# Patient Record
Sex: Female | Born: 1965 | Race: White | Hispanic: No | Smoking: Never smoker
Health system: Southern US, Community
[De-identification: ages and names within clinical notes are randomized; demographics above are authoritative.]

## PROBLEM LIST (undated history)

## (undated) DIAGNOSIS — C4491 Basal cell carcinoma of skin, unspecified: Secondary | ICD-10-CM

## (undated) HISTORY — DX: Basal cell carcinoma of skin, unspecified: C44.91

---

## 1999-09-27 ENCOUNTER — Inpatient Hospital Stay (HOSPITAL_COMMUNITY): Admission: AD | Admit: 1999-09-27 | Discharge: 1999-09-29 | Payer: Self-pay | Admitting: Physical Therapy

## 1999-12-17 ENCOUNTER — Emergency Department (HOSPITAL_COMMUNITY): Admission: EM | Admit: 1999-12-17 | Discharge: 1999-12-17 | Payer: Self-pay | Admitting: Emergency Medicine

## 2000-02-24 ENCOUNTER — Ambulatory Visit (HOSPITAL_COMMUNITY): Admission: RE | Admit: 2000-02-24 | Discharge: 2000-02-24 | Payer: Self-pay | Admitting: Family Medicine

## 2000-02-24 ENCOUNTER — Encounter: Payer: Self-pay | Admitting: Family Medicine

## 2001-02-23 ENCOUNTER — Other Ambulatory Visit: Admission: RE | Admit: 2001-02-23 | Discharge: 2001-02-23 | Payer: Self-pay | Admitting: Obstetrics and Gynecology

## 2002-03-03 ENCOUNTER — Other Ambulatory Visit: Admission: RE | Admit: 2002-03-03 | Discharge: 2002-03-03 | Payer: Self-pay | Admitting: Obstetrics and Gynecology

## 2003-03-26 ENCOUNTER — Other Ambulatory Visit: Admission: RE | Admit: 2003-03-26 | Discharge: 2003-03-26 | Payer: Self-pay | Admitting: Obstetrics and Gynecology

## 2004-03-25 ENCOUNTER — Other Ambulatory Visit: Admission: RE | Admit: 2004-03-25 | Discharge: 2004-03-25 | Payer: Self-pay | Admitting: Obstetrics and Gynecology

## 2004-10-02 ENCOUNTER — Other Ambulatory Visit: Admission: RE | Admit: 2004-10-02 | Discharge: 2004-10-02 | Payer: Self-pay | Admitting: Obstetrics and Gynecology

## 2005-03-04 ENCOUNTER — Encounter: Admission: RE | Admit: 2005-03-04 | Discharge: 2005-03-04 | Payer: Self-pay | Admitting: Internal Medicine

## 2005-05-06 ENCOUNTER — Other Ambulatory Visit: Admission: RE | Admit: 2005-05-06 | Discharge: 2005-05-06 | Payer: Self-pay | Admitting: Obstetrics and Gynecology

## 2014-01-26 DIAGNOSIS — C4491 Basal cell carcinoma of skin, unspecified: Secondary | ICD-10-CM

## 2014-01-26 HISTORY — DX: Basal cell carcinoma of skin, unspecified: C44.91

## 2014-12-17 ENCOUNTER — Telehealth: Payer: Self-pay

## 2014-12-17 NOTE — Telephone Encounter (Signed)
Pre Visit call completed. 

## 2014-12-18 ENCOUNTER — Encounter: Payer: Self-pay | Admitting: Family

## 2014-12-18 ENCOUNTER — Telehealth: Payer: Self-pay | Admitting: Family

## 2014-12-18 ENCOUNTER — Ambulatory Visit (INDEPENDENT_AMBULATORY_CARE_PROVIDER_SITE_OTHER): Payer: 59 | Admitting: Family

## 2014-12-18 VITALS — BP 128/78 | HR 63 | Temp 98.5°F | Resp 18 | Ht 63.0 in | Wt 157.6 lb

## 2014-12-18 DIAGNOSIS — L989 Disorder of the skin and subcutaneous tissue, unspecified: Secondary | ICD-10-CM

## 2014-12-18 DIAGNOSIS — H33321 Round hole, right eye: Secondary | ICD-10-CM

## 2014-12-18 MED ORDER — CLOTRIMAZOLE-BETAMETHASONE 1-0.05 % EX CREA: 1.0000 "application " | TOPICAL_CREAM | Freq: Two times a day (BID) | CUTANEOUS | Status: DC

## 2014-12-18 NOTE — Progress Notes (Signed)
Subjective:    Patient ID: April Hendrix, female    DOB: 1965/05/30, 49 y.o.   MRN: [id number]  HPI  [name] is a 49 yr old female who presents today to establish care.  She reports that she is overall very healthy but has not had a PCP in some time.  She reports an area of skin irritation located on the left shoulder and on her back. Notes that she has applied hydrocortisone to the area on her shoulder and it will go away temporarily.   Reports that she was told that she had a "hole" in her retina by her optometrist Jules Husbands and was recommend that she be referred to opthalmology. However, per pt she could not complete referral because it needed to be initiated by PCP.   Review of Systems  Constitutional: Negative for unexpected weight change.  HENT: Negative for hearing loss and rhinorrhea.   Eyes: Negative for visual disturbance.  Respiratory: Negative for cough.   Cardiovascular: Negative for leg swelling.  Gastrointestinal: Negative for diarrhea and constipation.  Genitourinary: Negative for dysuria and frequency.  Musculoskeletal: Negative for myalgias and arthralgias.  Neurological: Negative for headaches.  Hematological: Negative for adenopathy.  Psychiatric/Behavioral:       Denies depression/anxiety, sees a counselor   History reviewed. No pertinent past medical history.  Social History   Social History  . Marital Status: Divorced    Spouse Name: N/A  . Number of Children: N/A  . Years of Education: N/A   Occupational History  . Not on file.   Social History Main Topics  . Smoking status: Never Smoker   . Smokeless tobacco: Not on file  . Alcohol Use: 3.6 - 4.2 oz/week    6-7 Standard drinks or equivalent per week  . Drug Use: No  . Sexual Activity: Not on file   Other Topics Concern  . Not on file   Social History Narrative    History reviewed. No pertinent past surgical history.  Family History  Problem Relation Age of Onset  .  Hyperlipidemia Mother   . Hypertension Mother   . Hyperlipidemia Father   . Cancer Father     prostate.  . Hypertension Father   . Stroke Maternal Grandmother   . Arthritis Maternal Grandmother   . Arthritis Maternal Grandfather   . Arthritis Paternal Grandmother   . Arthritis Paternal Grandfather   . Diabetes Neg Hx   . Heart disease Neg Hx     No Known Allergies  No current outpatient prescriptions on file prior to visit.   No current facility-administered medications on file prior to visit.    BP 128/78 mmHg  Pulse 63  Temp(Src) 98.5 F (36.9 C) (Oral)  Resp 18  Ht 5\' 3"  (1.6 m)  Wt 157 lb 9.6 oz (71.487 kg)  BMI 27.92 kg/m2  SpO2 100%       Objective:   Physical Exam  Constitutional: She is oriented to person, place, and time. She appears well-developed and well-nourished.  HENT:  Head: Normocephalic and atraumatic.  Cardiovascular: Normal rate, regular rhythm and normal heart sounds.   No murmur heard. Pulmonary/Chest: Effort normal and breath sounds normal. No respiratory distress. She has no wheezes.  Neurological: She is alert and oriented to person, place, and time.  Skin: Skin is warm and dry.  Raise dry appearing patches of skin noted on top of left shoulder and mid back  Psychiatric: She has a normal mood and affect. Her behavior  is normal. Judgment and thought content normal.          Assessment & Plan:  Skin lesions- ? Fungal, ? Eczema- will rx with lotrisone and refer to derm for further evaluation  Retinal problem- I don't have the details. We will request formal diagnosis and name of specialist that her optometrist recommends and place opthalmology referral.

## 2014-12-18 NOTE — Patient Instructions (Addendum)
Please contact us with the number of your optometrist so we can obtain info for your opthalmology referral. Welcome to [facility]!

## 2014-12-18 NOTE — Telephone Encounter (Signed)
Could you please contact Dr. Smitty Pluck optometrist's office and ask them:  1) What is retinal diagnosis for which they are referring patient to specialist?  2) Who do they recommend we set her up with?

## 2014-12-18 NOTE — Progress Notes (Signed)
Pre visit review using our clinic review tool, if applicable. No additional management support is needed unless otherwise documented below in the visit note. 

## 2014-12-18 NOTE — Telephone Encounter (Signed)
Spoke with Lilia Pro at [facility] in [place] [phone]). They previously referred pt to Dr Anderson Malta at [place] for a "retinal hole, OD" but pt's insurance requires referral. We can place referral to Dr Anderson Malta.

## 2014-12-25 ENCOUNTER — Encounter: Payer: Self-pay | Admitting: Family

## 2015-02-13 ENCOUNTER — Encounter: Payer: Self-pay | Admitting: Family

## 2015-02-13 ENCOUNTER — Ambulatory Visit (INDEPENDENT_AMBULATORY_CARE_PROVIDER_SITE_OTHER): Payer: BLUE CROSS/BLUE SHIELD | Admitting: Family

## 2015-02-13 VITALS — BP 131/71 | HR 65 | Temp 98.6°F | Resp 16 | Ht 63.0 in | Wt 157.0 lb

## 2015-02-13 DIAGNOSIS — Z23 Encounter for immunization: Secondary | ICD-10-CM | POA: Diagnosis not present

## 2015-02-13 DIAGNOSIS — Z0001 Encounter for general adult medical examination with abnormal findings: Secondary | ICD-10-CM | POA: Diagnosis not present

## 2015-02-13 DIAGNOSIS — N951 Menopausal and female climacteric states: Secondary | ICD-10-CM

## 2015-02-13 DIAGNOSIS — Z0000 Encounter for general adult medical examination without abnormal findings: Secondary | ICD-10-CM | POA: Diagnosis not present

## 2015-02-13 DIAGNOSIS — C4491 Basal cell carcinoma of skin, unspecified: Secondary | ICD-10-CM | POA: Insufficient documentation

## 2015-02-13 DIAGNOSIS — R232 Flushing: Secondary | ICD-10-CM | POA: Insufficient documentation

## 2015-02-13 LAB — CBC WITH DIFFERENTIAL/PLATELET
Basophils Absolute: 0 10*3/uL (ref 0.0–0.1)
Basophils Relative: 0.7 % (ref 0.0–3.0)
Eosinophils Absolute: 0.2 10*3/uL (ref 0.0–0.7)
Eosinophils Relative: 3.5 % (ref 0.0–5.0)
HCT: 42.2 % (ref 36.0–46.0)
Hemoglobin: 14.2 g/dL (ref 12.0–15.0)
Lymphocytes Relative: 23.8 % (ref 12.0–46.0)
Lymphs Abs: 1.4 10*3/uL (ref 0.7–4.0)
MCHC: 33.7 g/dL (ref 30.0–36.0)
MCV: 96.9 fl (ref 78.0–100.0)
Monocytes Absolute: 0.4 10*3/uL (ref 0.1–1.0)
Monocytes Relative: 6.7 % (ref 3.0–12.0)
Neutro Abs: 3.9 10*3/uL (ref 1.4–7.7)
Neutrophils Relative %: 65.3 % (ref 43.0–77.0)
Platelets: 245 10*3/uL (ref 150.0–400.0)
RBC: 4.35 Mil/uL (ref 3.87–5.11)
RDW: 11.9 % (ref 11.5–15.5)
WBC: 5.9 10*3/uL (ref 4.0–10.5)

## 2015-02-13 LAB — URINALYSIS, ROUTINE W REFLEX MICROSCOPIC
Bilirubin Urine: NEGATIVE
Hgb urine dipstick: NEGATIVE
Ketones, ur: NEGATIVE
Leukocytes, UA: NEGATIVE
Nitrite: NEGATIVE
RBC / HPF: NONE SEEN (ref 0–?)
Specific Gravity, Urine: 1.015 (ref 1.000–1.030)
Total Protein, Urine: NEGATIVE
Urine Glucose: NEGATIVE
Urobilinogen, UA: 0.2 (ref 0.0–1.0)
WBC, UA: NONE SEEN (ref 0–?)
pH: 7.5 (ref 5.0–8.0)

## 2015-02-13 LAB — LIPID PANEL
CHOLESTEROL: 261 mg/dL — AB (ref 0–200)
HDL: 108.9 mg/dL (ref >39.00)
LDL Cholesterol: 138 mg/dL — ABNORMAL HIGH (ref 0–99)
NonHDL: 152.25
TRIGLYCERIDES: 72 mg/dL (ref 0.0–149.0)
Total CHOL/HDL Ratio: 2
VLDL: 14.4 mg/dL (ref 0.0–40.0)

## 2015-02-13 LAB — HEPATIC FUNCTION PANEL
ALT: 13 U/L (ref 0–35)
AST: 18 U/L (ref 0–37)
Albumin: 4.5 g/dL (ref 3.5–5.2)
Alkaline Phosphatase: 54 U/L (ref 39–117)
Bilirubin, Direct: 0.1 mg/dL (ref 0.0–0.3)
Total Bilirubin: 0.7 mg/dL (ref 0.2–1.2)
Total Protein: 7.2 g/dL (ref 6.0–8.3)

## 2015-02-13 LAB — BASIC METABOLIC PANEL
BUN: 14 mg/dL (ref 6–23)
CO2: 27 mEq/L (ref 19–32)
Calcium: 9.4 mg/dL (ref 8.4–10.5)
Chloride: 104 mEq/L (ref 96–112)
Creatinine, Ser: 0.76 mg/dL (ref 0.40–1.20)
GFR: 85.2 mL/min (ref >60.00)
Glucose, Bld: 108 mg/dL — ABNORMAL HIGH (ref 70–99)
Potassium: 4.2 mEq/L (ref 3.5–5.1)
Sodium: 139 mEq/L (ref 135–145)

## 2015-02-13 LAB — TSH: TSH: 3.35 u[IU]/mL (ref 0.35–4.50)

## 2015-02-13 MED ORDER — GABAPENTIN 300 MG PO CAPS: 300.0000 mg | ORAL_CAPSULE | Freq: Every day | ORAL | Status: AC

## 2015-02-13 NOTE — Progress Notes (Signed)
Pre visit review using our clinic review tool, if applicable. No additional management support is needed unless otherwise documented below in the visit note. 

## 2015-02-13 NOTE — Assessment & Plan Note (Addendum)
Discussed healthy diet, exercise.  Refer for colonoscopy. Discussed increasing exercise.obtain routine lab work. Tdap today.

## 2015-02-13 NOTE — Patient Instructions (Addendum)
Please complete lab work prior to leaving. You will be contacted about your referral for colonoscopy. Please schedule routine dental follow up. Start gabapentin 300mg  cap at bedtime for hot flashes.

## 2015-02-13 NOTE — Assessment & Plan Note (Signed)
Will give trial of gabapentin QHS for hot flashes.

## 2015-02-13 NOTE — Progress Notes (Signed)
Subjective:    Patient ID: April Hendrix, female    DOB: 09-01-1965, 50 y.o.   MRN: [id number]  HPI  Patient presents today for complete physical.  Immunizations: due for tetanus, declines flu shot Diet: diet is healthy Exercise:  A little- plans to start tennis Colonoscopy:  due Pap Smear: 4/16- normal per pt completed by GYN Mammogram: 4/16 Vision:  Up to date Dental: due- got dental insurance  Notes that spots on her back were basal cell carcinoma.  Removed by dermatology  She complains of hot flashes.  They are especially bad a night.   Review of Systems  Constitutional: Negative for unexpected weight change.  HENT: Negative for hearing loss.   Eyes: Negative for visual disturbance.  Respiratory: Negative for cough.   Cardiovascular: Negative for chest pain and leg swelling.  Gastrointestinal: Negative for diarrhea and constipation.  Genitourinary: Negative for dysuria and frequency.  Musculoskeletal: Negative for myalgias and arthralgias.  Skin: Negative for rash.  Neurological: Negative for headaches.  Hematological: Negative for adenopathy.  Psychiatric/Behavioral:       Denies depression/anxiety   Past Medical History  Diagnosis Date  . Basal cell carcinoma 2016    2 lesions on back ([facility] [place] [facility])    Social History   Social History  . Marital Status: Divorced    Spouse Name: N/A  . Number of Children: N/A  . Years of Education: N/A   Occupational History  . Not on file.   Social History Main Topics  . Smoking status: Never Smoker   . Smokeless tobacco: Not on file  . Alcohol Use: 3.6 - 4.2 oz/week    6-7 Standard drinks or equivalent per week  . Drug Use: No  . Sexual Activity: Not on file   Other Topics Concern  . 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     Single   Enjoys working, taking care of her kids    No past surgical history on file.  Family History  Problem Relation Age of Onset  . Hyperlipidemia Mother   . Hypertension Mother   . Hyperlipidemia Father   . Cancer Father     prostate.  . Hypertension Father   . Stroke Maternal Grandmother   . Arthritis Maternal Grandmother   . Arthritis Maternal Grandfather   . Arthritis Paternal Grandmother   . Arthritis Paternal Grandfather   . Diabetes Neg Hx   . Heart disease Neg Hx     No Known Allergies  No current outpatient prescriptions on file prior to visit.   No current facility-administered medications on file prior to visit.    BP 131/71 mmHg  Pulse 65  Temp(Src) 98.6 F (37 C) (Oral)  Resp 16  Ht 5\' 3"  (1.6 m)  Wt 157 lb (71.215 kg)  BMI 27.82 kg/m2  SpO2 100%       Objective:   Physical Exam  Physical Exam  Constitutional: She is oriented to person, place, and time. She appears well-developed and well-nourished. No distress.  HENT:  Head: Normocephalic and atraumatic.  Right Ear: Tympanic membrane and ear canal normal.  Left Ear: Tympanic membrane and ear canal normal.  Mouth/Throat: Oropharynx is clear and moist.  Eyes: Pupils are equal, round, and reactive to light. No scleral  icterus.  Neck: Normal range of motion. No thyromegaly present.  Cardiovascular: Normal rate and regular rhythm.   No murmur heard. Pulmonary/Chest: Effort normal and breath sounds normal. No respiratory distress. He has no wheezes. She has no rales. She exhibits no tenderness.  Abdominal: Soft. Bowel sounds are normal. He exhibits no distension and no mass. There is no tenderness. There is no rebound and no guarding.  Musculoskeletal: She exhibits no edema.  Lymphadenopathy:    She has no cervical adenopathy.  Neurological: She is alert and oriented to person, place, and time. She has normal patellar reflexes. She exhibits normal muscle tone. Coordination normal.  Skin:  Skin is warm and dry.  Psychiatric: She has a normal mood and affect. Her behavior is normal. Judgment and thought content normal.  Breasts: Examined lying Right: Without masses, retractions, discharge or axillary adenopathy.  Left: Without masses, retractions, discharge or axillary adenopathy.      Assessment & Plan:         Assessment & Plan:  EKG tracing is personally reviewed.  EKG notes NSR.  No acute changes.

## 2015-02-17 ENCOUNTER — Telehealth: Payer: Self-pay | Admitting: Family

## 2015-02-17 DIAGNOSIS — E785 Hyperlipidemia, unspecified: Secondary | ICD-10-CM | POA: Insufficient documentation

## 2015-02-17 DIAGNOSIS — R739 Hyperglycemia, unspecified: Secondary | ICD-10-CM

## 2015-02-17 NOTE — Telephone Encounter (Signed)
Please let pt know cholesterol is elevated, sugar mildly elevated but not in diabetic range.  Advise pt to work on low fat/low cholesterol diet, exercise and avoiding concentrated sweets.  Repeat labs in 6 months (a1c dx hyperglycemia, lipids- hyperlipidemia)

## 2015-02-18 NOTE — Telephone Encounter (Signed)
Notified pt and she voices understanding. Lab appt scheduled for 08/20/15 at 9am.  Future orders entered.

## 2015-05-02 DIAGNOSIS — F4310 Post-traumatic stress disorder, unspecified: Secondary | ICD-10-CM | POA: Diagnosis not present

## 2015-05-13 DIAGNOSIS — Z6827 Body mass index (BMI) 27.0-27.9, adult: Secondary | ICD-10-CM | POA: Diagnosis not present

## 2015-05-13 DIAGNOSIS — Z1231 Encounter for screening mammogram for malignant neoplasm of breast: Secondary | ICD-10-CM | POA: Diagnosis not present

## 2015-05-13 DIAGNOSIS — Z01419 Encounter for gynecological examination (general) (routine) without abnormal findings: Secondary | ICD-10-CM | POA: Diagnosis not present

## 2015-05-16 DIAGNOSIS — Z85828 Personal history of other malignant neoplasm of skin: Secondary | ICD-10-CM | POA: Diagnosis not present

## 2015-05-16 DIAGNOSIS — L578 Other skin changes due to chronic exposure to nonionizing radiation: Secondary | ICD-10-CM | POA: Diagnosis not present

## 2015-05-16 DIAGNOSIS — L299 Pruritus, unspecified: Secondary | ICD-10-CM | POA: Diagnosis not present

## 2015-05-16 DIAGNOSIS — Z08 Encounter for follow-up examination after completed treatment for malignant neoplasm: Secondary | ICD-10-CM | POA: Diagnosis not present

## 2015-05-17 ENCOUNTER — Other Ambulatory Visit: Payer: Self-pay | Admitting: Obstetrics and Gynecology

## 2015-05-17 DIAGNOSIS — R928 Other abnormal and inconclusive findings on diagnostic imaging of breast: Secondary | ICD-10-CM

## 2015-05-23 ENCOUNTER — Other Ambulatory Visit: Payer: BLUE CROSS/BLUE SHIELD

## 2015-05-24 ENCOUNTER — Other Ambulatory Visit: Payer: BLUE CROSS/BLUE SHIELD

## 2015-05-24 ENCOUNTER — Ambulatory Visit
Admission: RE | Admit: 2015-05-24 | Discharge: 2015-05-24 | Disposition: A | Discharge status: Home / Self Care | Payer: BLUE CROSS/BLUE SHIELD | Source: Ambulatory Visit | Attending: Obstetrics and Gynecology | Admitting: Obstetrics and Gynecology

## 2015-05-24 DIAGNOSIS — R922 Inconclusive mammogram: Secondary | ICD-10-CM | POA: Diagnosis not present

## 2015-05-24 DIAGNOSIS — N63 Unspecified lump in breast: Secondary | ICD-10-CM | POA: Diagnosis not present

## 2015-05-24 DIAGNOSIS — R928 Other abnormal and inconclusive findings on diagnostic imaging of breast: Secondary | ICD-10-CM

## 2015-08-20 ENCOUNTER — Other Ambulatory Visit: Payer: BLUE CROSS/BLUE SHIELD

## 2015-09-02 ENCOUNTER — Other Ambulatory Visit (INDEPENDENT_AMBULATORY_CARE_PROVIDER_SITE_OTHER): Payer: BLUE CROSS/BLUE SHIELD

## 2015-09-02 ENCOUNTER — Encounter: Payer: Self-pay | Admitting: Family

## 2015-09-02 DIAGNOSIS — R739 Hyperglycemia, unspecified: Secondary | ICD-10-CM

## 2015-09-02 DIAGNOSIS — E785 Hyperlipidemia, unspecified: Secondary | ICD-10-CM | POA: Diagnosis not present

## 2015-09-02 LAB — LIPID PANEL
CHOL/HDL RATIO: 2
Cholesterol: 252 mg/dL — ABNORMAL HIGH (ref 0–200)
HDL: 115.4 mg/dL (ref >39.00)
LDL CALC: 123 mg/dL — AB (ref 0–99)
NONHDL: 136.53
Triglycerides: 67 mg/dL (ref 0.0–149.0)
VLDL: 13.4 mg/dL (ref 0.0–40.0)

## 2015-09-02 LAB — HEMOGLOBIN A1C: HEMOGLOBIN A1C: 4.8 % (ref 4.6–6.5)

## 2015-11-15 DIAGNOSIS — L814 Other melanin hyperpigmentation: Secondary | ICD-10-CM | POA: Diagnosis not present

## 2015-11-15 DIAGNOSIS — L821 Other seborrheic keratosis: Secondary | ICD-10-CM | POA: Diagnosis not present

## 2015-11-15 DIAGNOSIS — Z08 Encounter for follow-up examination after completed treatment for malignant neoplasm: Secondary | ICD-10-CM | POA: Diagnosis not present

## 2015-11-15 DIAGNOSIS — L281 Prurigo nodularis: Secondary | ICD-10-CM | POA: Diagnosis not present

## 2015-11-15 DIAGNOSIS — Z85828 Personal history of other malignant neoplasm of skin: Secondary | ICD-10-CM | POA: Diagnosis not present

## 2016-08-11 DIAGNOSIS — Z01419 Encounter for gynecological examination (general) (routine) without abnormal findings: Secondary | ICD-10-CM | POA: Diagnosis not present

## 2016-08-11 DIAGNOSIS — Z6828 Body mass index (BMI) 28.0-28.9, adult: Secondary | ICD-10-CM | POA: Diagnosis not present

## 2016-08-21 DIAGNOSIS — Z7251 High risk heterosexual behavior: Secondary | ICD-10-CM | POA: Diagnosis not present

## 2016-08-21 DIAGNOSIS — N771 Vaginitis, vulvitis and vulvovaginitis in diseases classified elsewhere: Secondary | ICD-10-CM | POA: Diagnosis not present

## 2016-09-21 DIAGNOSIS — A609 Anogenital herpesviral infection, unspecified: Secondary | ICD-10-CM | POA: Diagnosis not present

## 2016-09-21 DIAGNOSIS — N760 Acute vaginitis: Secondary | ICD-10-CM | POA: Diagnosis not present

## 2016-11-24 ENCOUNTER — Ambulatory Visit (HOSPITAL_BASED_OUTPATIENT_CLINIC_OR_DEPARTMENT_OTHER)
Admission: RE | Admit: 2016-11-24 | Discharge: 2016-11-24 | Disposition: A | Discharge status: Home / Self Care | Payer: BLUE CROSS/BLUE SHIELD | Source: Ambulatory Visit | Attending: Medical | Admitting: Medical

## 2016-11-24 ENCOUNTER — Encounter: Payer: Self-pay | Admitting: Medical

## 2016-11-24 ENCOUNTER — Ambulatory Visit (INDEPENDENT_AMBULATORY_CARE_PROVIDER_SITE_OTHER): Payer: BLUE CROSS/BLUE SHIELD | Admitting: Medical

## 2016-11-24 ENCOUNTER — Telehealth: Payer: Self-pay | Admitting: Medical

## 2016-11-24 VITALS — BP 124/79 | HR 80 | Temp 98.6°F | Resp 16 | Ht 63.0 in | Wt 158.4 lb

## 2016-11-24 DIAGNOSIS — M25571 Pain in right ankle and joints of right foot: Secondary | ICD-10-CM | POA: Diagnosis not present

## 2016-11-24 DIAGNOSIS — M79671 Pain in right foot: Secondary | ICD-10-CM

## 2016-11-24 MED ORDER — MELOXICAM 7.5 MG PO TABS: 7.5000 mg | ORAL_TABLET | Freq: Every day | ORAL | 0 refills | Status: AC

## 2016-11-24 NOTE — Patient Instructions (Addendum)
For ankle and foot pain will get xray of areas today.  RICE therapy.   meloxicam for pain and inflammation. DC otc nsaids.  After xray review will likely refer to sports medicine.  Follow up date to be determined

## 2016-11-24 NOTE — Telephone Encounter (Signed)
Refer to sports med. Dr. Barbaraann Barthel.

## 2016-11-24 NOTE — Progress Notes (Signed)
Subjective:    Patient ID: April Hendrix, female    DOB: [birth year], 51 y.o.   MRN: [id number]  HPI  Pt in with some foot pain/ankle pain for 2 weeks following inversion injury. Pt states some pain walking and then last week increased pain after tennis.   Occasional advil for pain.  Pt has elevated and applied ice. Occasional alleve.  Pt states after long walks 7-8/10 pain.    Review of Systems  Constitutional: Negative for chills, fatigue and fever.  Respiratory: Negative for cough, chest tightness, shortness of breath and wheezing.   Cardiovascular: Negative for chest pain and palpitations.  Gastrointestinal: Negative for abdominal pain.  Musculoskeletal: Negative for back pain, gait problem, joint swelling and neck stiffness.       Left- ankle and foot pain.  Skin: Negative for rash.  Neurological: Negative for dizziness, syncope, weakness, light-headedness and numbness.  Hematological: Negative for adenopathy. Does not bruise/bleed easily.  Psychiatric/Behavioral: Negative for behavioral problems, confusion and sleep disturbance. The patient is not nervous/anxious.     Past Medical History:  Diagnosis Date  . Basal cell carcinoma 2016   2 lesions on back ([facility] [place] [facility])     Social History   Social History  . Marital status: Divorced    Spouse name: N/A  . Number of children: N/A  . Years of education: N/A   Occupational History  . Not on file.   Social History Main Topics  . Smoking status: Never Smoker  . Smokeless tobacco: Never Used  . Alcohol use 3.6 - 4.2 oz/week    6 - 7 Standard drinks or equivalent per week  . Drug use: No  . Sexual activity: Not on file   Other Topics Concern  . 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   Single   Enjoys working, taking care  of her kids    No past surgical history on file.  Family History  Problem Relation Age of Onset  . Hyperlipidemia Mother   . Hypertension Mother   . Hyperlipidemia Father   . Cancer Father        prostate.  . Hypertension Father   . Stroke Maternal Grandmother   . Arthritis Maternal Grandmother   . Arthritis Maternal Grandfather   . Arthritis Paternal Grandmother   . Arthritis Paternal Grandfather   . Diabetes Neg Hx   . Heart disease Neg Hx     No Known Allergies  Current Outpatient Prescriptions on File Prior to Visit  Medication Sig Dispense Refill  . gabapentin (NEURONTIN) 300 MG capsule Take 1 capsule (300 mg total) by mouth at bedtime. 30 capsule 3   No current facility-administered medications on file prior to visit.     BP 124/79   Pulse 80   Temp 98.6 F (37 C) (Oral)   Resp 16   Ht 5\' 3"  (1.6 m)   Wt 158 lb 6.4 oz (71.8 kg)   SpO2 100%   BMI 28.06 kg/m       Objective:   Physical Exam    General- No acute distress. Pleasant patient. Neurologic- CNII- XII grossly intact.  Rt ankle- top of ankle/anterior aspect tender with some medial aspect tender. Rt foot- mild tender on top of mid foot. vascular-pulses normal  left foot.     Assessment & Plan:  For ankle and foot pain will get xray of areas today.  RICE therapy.   meloxicam for pain and inflammation. DC otc nsaids.  After xray review will likely refer to sports medicine.  Follow up date to be determined  Rechelle Niebla, Percell Miller, PA-C

## 2016-12-02 ENCOUNTER — Ambulatory Visit (INDEPENDENT_AMBULATORY_CARE_PROVIDER_SITE_OTHER): Payer: BLUE CROSS/BLUE SHIELD | Admitting: Family Medicine

## 2016-12-02 ENCOUNTER — Encounter: Payer: Self-pay | Admitting: Family Medicine

## 2016-12-02 DIAGNOSIS — M79671 Pain in right foot: Secondary | ICD-10-CM

## 2016-12-02 NOTE — Patient Instructions (Addendum)
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 Ice the area 15 minutes at a time 3-4 times a day. Take the meloxicam as directed for 7-10 days then as needed. Do home exercises 3 sets of 10 once a day with theraband. Consider cam walker, nitro patches, physical therapy if not improving as expected. [place] for hitting but I wouldn't do any running on the court, serving for about 2 weeks. Follow up with me in 5-6 weeks for reevaluation but you can call me sooner if you're struggling.

## 2016-12-06 ENCOUNTER — Encounter: Payer: Self-pay | Admitting: Family Medicine

## 2016-12-06 DIAGNOSIS — M79671 Pain in right foot: Secondary | ICD-10-CM | POA: Insufficient documentation

## 2016-12-06 NOTE — Assessment & Plan Note (Signed)
2/2 anterior tibialis tendinopathy.  Icing, start meloxicam.  Shown home exercises to do daily.  Discussed return to tennis.  Consider cam walker, nitro patches, physical therapy if not improving.  F/u in 5-6 weeks.

## 2016-12-06 NOTE — Progress Notes (Signed)
PCP: Debbrah Alar, NP Consultation requested by: Mackie Pai PA-C  Subjective:   HPI: Patient is a 51 y.o. female here for right foot pain.  Patient reports she's had about 1 month of anterior right foot/ankle pain. No acute injury right before this started - she rolled foot a little when wearing stacked shoes but started bothering really when she was at a conference doing a lot of walking and standing. 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 Pain level 2/10 and dull at rest, sharp with activities. Has tried wrapping, icing. Given rx for mobic but hasn't tried yet. No skin changes, numbness.  Past Medical History:  Diagnosis Date  . Basal cell carcinoma 2016   2 lesions on back ([facility] [place] [facility])    Current Outpatient Medications on File Prior to Visit  Medication Sig Dispense Refill  . ALPRAZolam (XANAX) 0.25 MG tablet take 1 tablet by mouth every 8 hours if needed  0  . gabapentin (NEURONTIN) 300 MG capsule Take 1 capsule (300 mg total) by mouth at bedtime. 30 capsule 3  . meloxicam (MOBIC) 7.5 MG tablet Take 1 tablet (7.5 mg total) by mouth daily. 30 tablet 0  . valACYclovir (VALTREX) 1000 MG tablet   1   No current facility-administered medications on file prior to visit.     History reviewed. No pertinent surgical history.  No Known Allergies  Social History   Socioeconomic History  . Marital status: Divorced    Spouse name: Not on file  . Number of children: Not on file  . Years of education: Not on file  . Highest education level: Not on file  Social Needs  . Financial resource strain: Not on file  . Food insecurity - worry: Not on file  . Food insecurity - inability: Not on file  . Transportation needs - medical: Not on file  . Transportation needs - non-medical: Not on file  Occupational History  . Not on file  Tobacco Use  . Smoking status: Never Smoker  . Smokeless tobacco: Never Used  Substance and Sexual  Activity  . Alcohol use: Yes    Alcohol/week: 3.6 - 4.2 oz    Types: 6 - 7 Standard drinks or equivalent per week  . Drug use: No  . Sexual activity: Not on file  Other Topics Concern  . 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   Single   Enjoys working, taking care of her kids    Family History  Problem Relation Age of Onset  . Hyperlipidemia Mother   . Hypertension Mother   . Hyperlipidemia Father   . Cancer Father        prostate.  . Hypertension Father   . Stroke Maternal Grandmother   . Arthritis Maternal Grandmother   . Arthritis Maternal Grandfather   . Arthritis Paternal Grandmother   . Arthritis Paternal Grandfather   . Diabetes Neg Hx   . Heart disease Neg Hx     BP (!) 138/107   Pulse 80   Ht 5\' 3"  (1.6 m)   Wt 150 lb (68 kg)   BMI 26.57 kg/m   Review of Systems: See HPI above.     Objective:  Physical Exam:  Gen: NAD, comfortable in exam room  Right foot/ankle: No gross deformity, swelling, ecchymoses FROM with 5/5 strength. TTP mildly over anterior tibialis. Negative ant drawer and talar tilt.   Negative syndesmotic compression. Thompsons test negative. NV intact distally.   MSK u/s:  No abnormalities of foot or ankle.  Anterior tibialis intact without tears or tenosynovitis.  Assessment & Plan:  1. Right foot pain - 2/2 anterior tibialis tendinopathy.  Icing, start meloxicam.  Shown home exercises to do daily.  Discussed return to tennis.  Consider cam walker, nitro patches, physical therapy if not improving.  F/u in 5-6 weeks.

## 2017-10-28 DIAGNOSIS — Z6826 Body mass index (BMI) 26.0-26.9, adult: Secondary | ICD-10-CM | POA: Diagnosis not present

## 2017-10-28 DIAGNOSIS — Z01419 Encounter for gynecological examination (general) (routine) without abnormal findings: Secondary | ICD-10-CM | POA: Diagnosis not present

## 2017-11-23 DIAGNOSIS — Z30433 Encounter for removal and reinsertion of intrauterine contraceptive device: Secondary | ICD-10-CM | POA: Diagnosis not present

## 2018-01-06 DIAGNOSIS — N939 Abnormal uterine and vaginal bleeding, unspecified: Secondary | ICD-10-CM | POA: Diagnosis not present

## 2018-10-22 DIAGNOSIS — N3000 Acute cystitis without hematuria: Secondary | ICD-10-CM | POA: Diagnosis not present

## 2018-10-22 DIAGNOSIS — R8290 Unspecified abnormal findings in urine: Secondary | ICD-10-CM | POA: Diagnosis not present

## 2018-12-29 DIAGNOSIS — Z01419 Encounter for gynecological examination (general) (routine) without abnormal findings: Secondary | ICD-10-CM | POA: Diagnosis not present

## 2018-12-29 DIAGNOSIS — Z6828 Body mass index (BMI) 28.0-28.9, adult: Secondary | ICD-10-CM | POA: Diagnosis not present

## 2019-01-06 DIAGNOSIS — F411 Generalized anxiety disorder: Secondary | ICD-10-CM | POA: Diagnosis not present

## 2019-01-06 DIAGNOSIS — Z6827 Body mass index (BMI) 27.0-27.9, adult: Secondary | ICD-10-CM | POA: Diagnosis not present

## 2019-01-06 DIAGNOSIS — I10 Essential (primary) hypertension: Secondary | ICD-10-CM | POA: Diagnosis not present

## 2019-01-06 DIAGNOSIS — F331 Major depressive disorder, recurrent, moderate: Secondary | ICD-10-CM | POA: Diagnosis not present

## 2019-01-19 IMAGING — CR DG ANKLE COMPLETE 3+V*R*
3 series · 3 of 3 positions shown · non-contrast
Comparison: No recent.

CLINICAL DATA: Rolled ankle 3 weeks ago.  Pain.

EXAM:
RIGHT ANKLE - COMPLETE 3+ VIEW

[t ankle joint ap right]
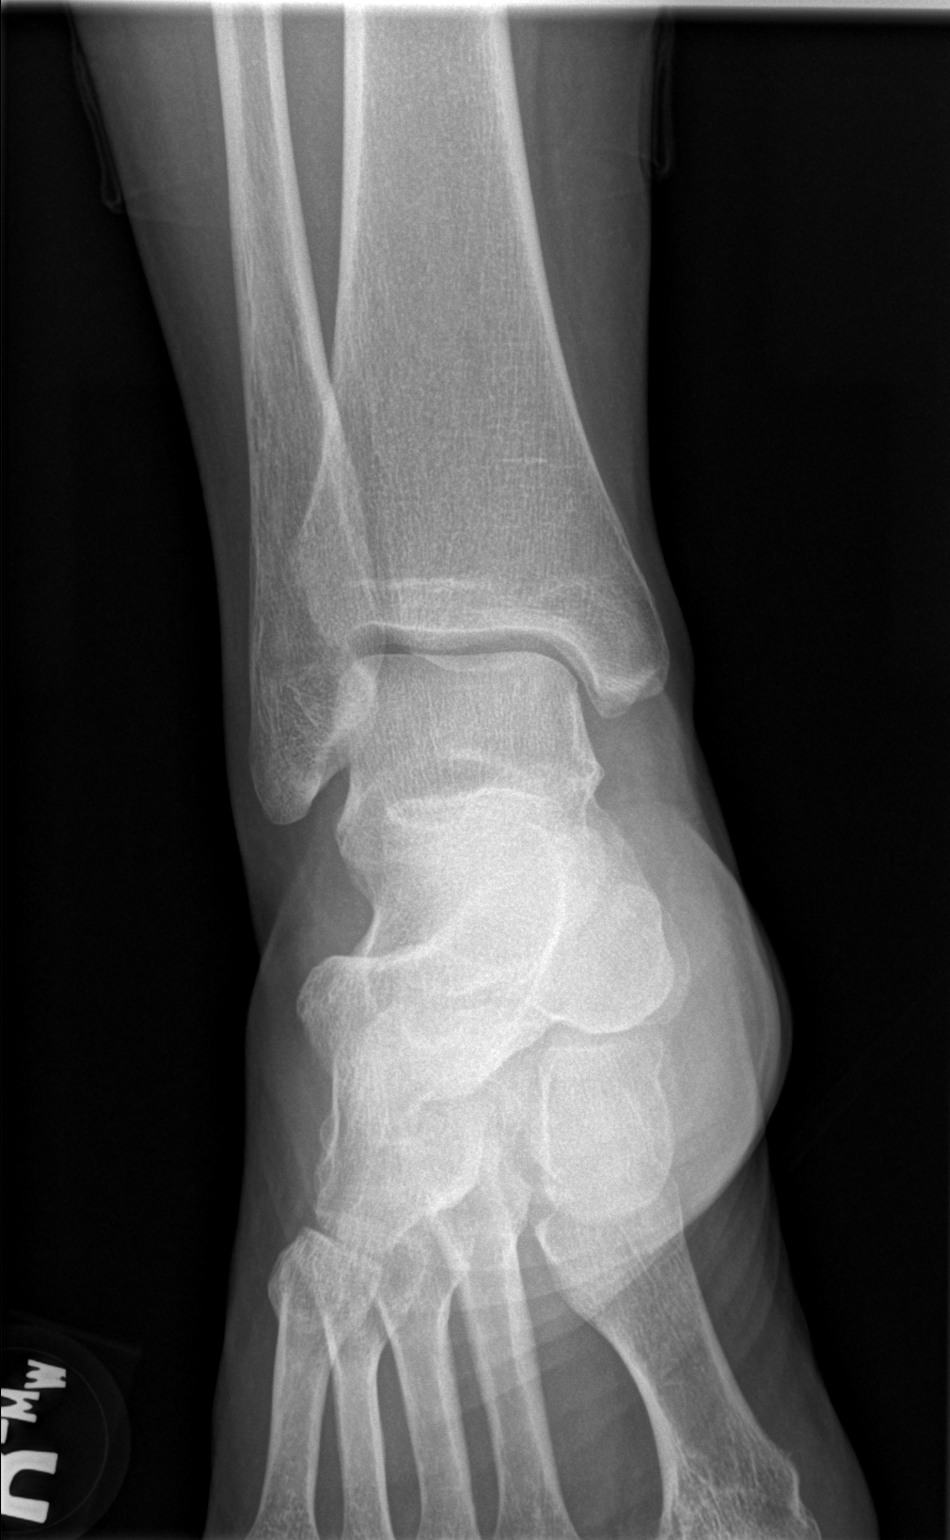

[t ankle joint oblique right]
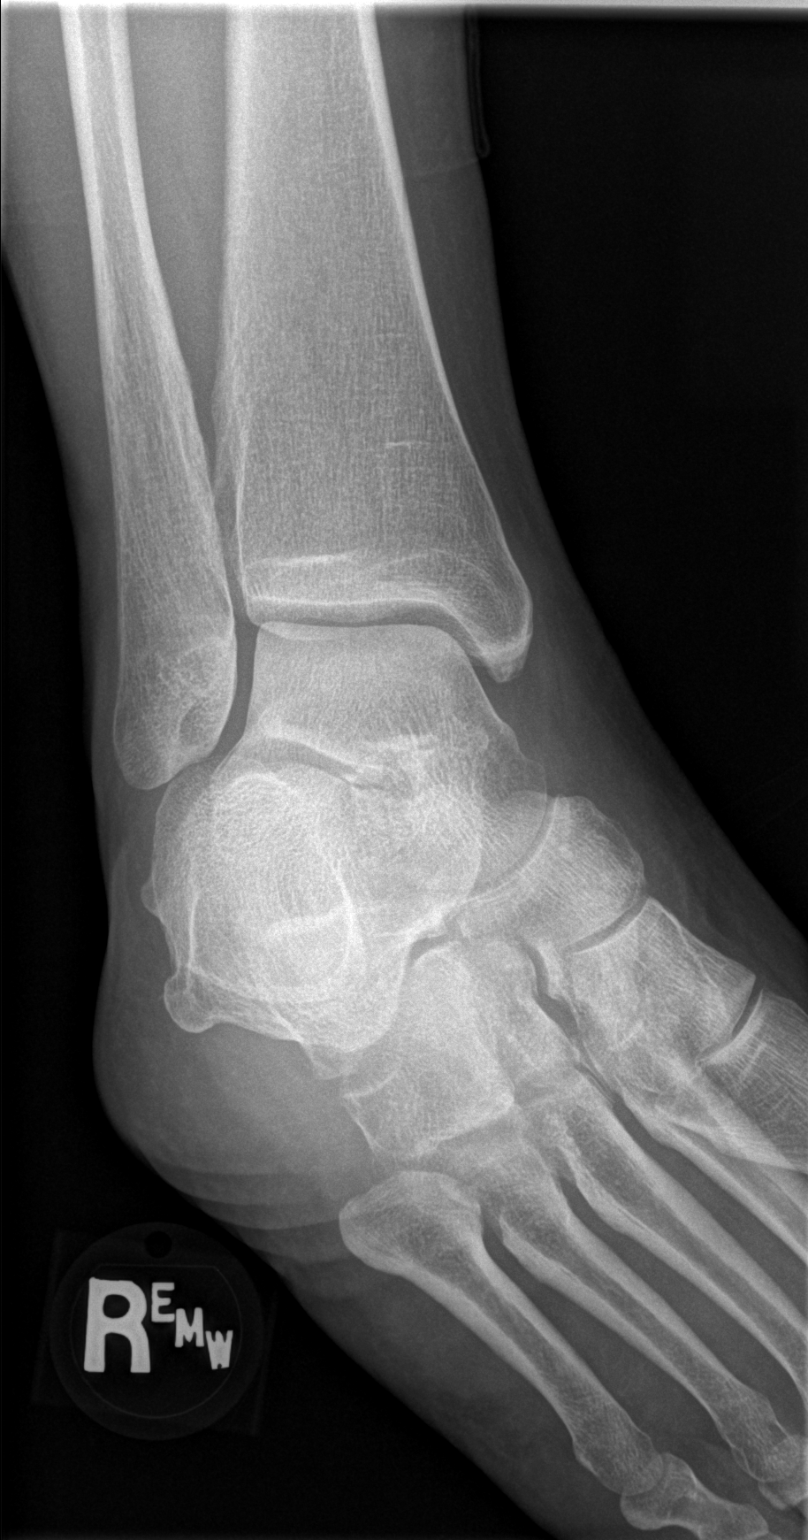

[t ankle joint lat right]
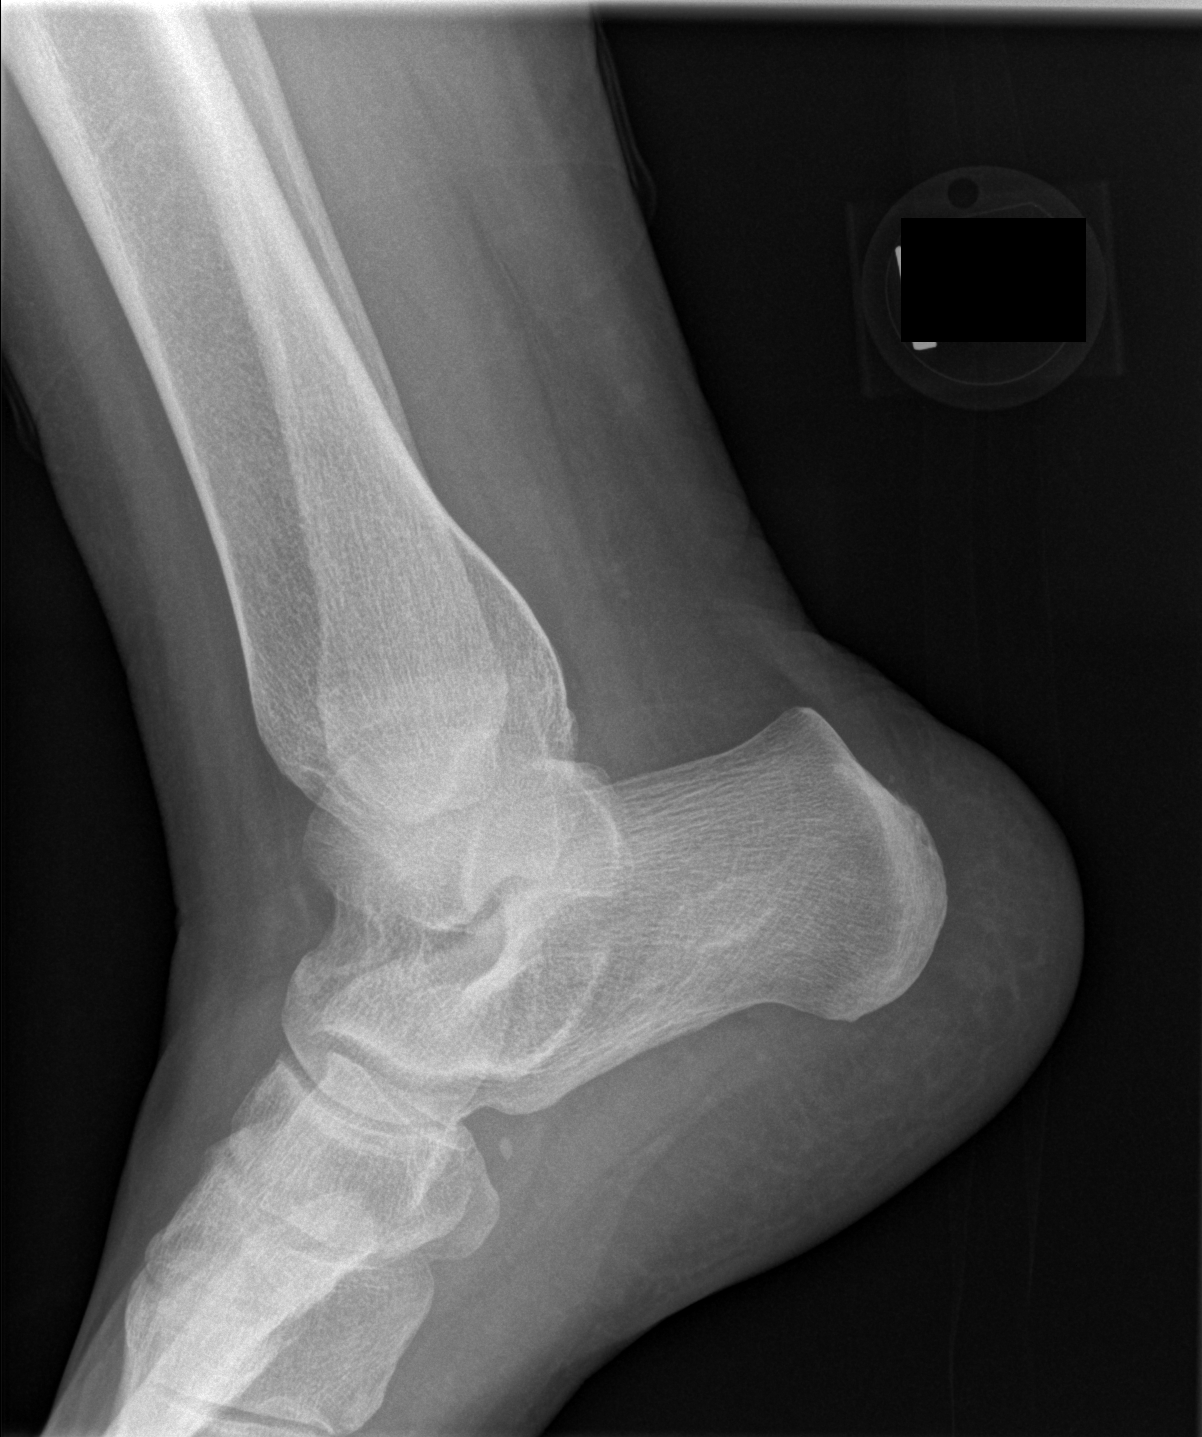

[3 of 3 positions shown; findings below may reference images not displayed]

FINDINGS: No acute soft tissue bony abnormality. No acute bony abnormality
identified. No radiopaque foreign body.
IMPRESSION: No acute bony or joint abnormality identified. No evidence of
fracture or dislocation.

## 2019-02-03 DIAGNOSIS — F331 Major depressive disorder, recurrent, moderate: Secondary | ICD-10-CM | POA: Diagnosis not present

## 2019-02-03 DIAGNOSIS — F411 Generalized anxiety disorder: Secondary | ICD-10-CM | POA: Diagnosis not present

## 2019-02-03 DIAGNOSIS — G4700 Insomnia, unspecified: Secondary | ICD-10-CM | POA: Diagnosis not present

## 2019-02-03 DIAGNOSIS — I10 Essential (primary) hypertension: Secondary | ICD-10-CM | POA: Diagnosis not present

## 2019-03-17 DIAGNOSIS — F411 Generalized anxiety disorder: Secondary | ICD-10-CM | POA: Diagnosis not present

## 2019-03-17 DIAGNOSIS — I10 Essential (primary) hypertension: Secondary | ICD-10-CM | POA: Diagnosis not present

## 2019-03-17 DIAGNOSIS — F331 Major depressive disorder, recurrent, moderate: Secondary | ICD-10-CM | POA: Diagnosis not present

## 2019-04-13 ENCOUNTER — Ambulatory Visit: Payer: BC Managed Care – PPO | Attending: Internal Medicine

## 2019-04-13 DIAGNOSIS — Z23 Encounter for immunization: Secondary | ICD-10-CM

## 2019-04-13 NOTE — Progress Notes (Signed)
   Covid-19 Vaccination Clinic  Name:  April Hendrix    MRN: [id number] DOB: 03-Feb-1965  04/13/2019  [name] was observed post Covid-19 immunization for 15 minutes without incident. She was provided with Vaccine Information Sheet and instruction to access the V-Safe system.   [name] was instructed to call 911 with any severe reactions post vaccine: Marland Kitchen Difficulty breathing  . Swelling of face and throat  . A fast heartbeat  . A bad rash all over body  . Dizziness and weakness   Immunizations Administered    Name Date Dose VIS Date Route   Pfizer COVID-19 Vaccine 04/13/2019 11:53 AM 0.3 mL 01/06/2019 Intramuscular   Manufacturer: [place]   Lot: [id number]   [place]: [id number]

## 2019-05-08 ENCOUNTER — Ambulatory Visit: Payer: BC Managed Care – PPO

## 2021-04-29 DIAGNOSIS — F331 Major depressive disorder, recurrent, moderate: Secondary | ICD-10-CM | POA: Diagnosis not present

## 2021-04-29 DIAGNOSIS — I10 Essential (primary) hypertension: Secondary | ICD-10-CM | POA: Diagnosis not present

## 2021-04-29 DIAGNOSIS — F411 Generalized anxiety disorder: Secondary | ICD-10-CM | POA: Diagnosis not present

## 2021-04-29 DIAGNOSIS — G4700 Insomnia, unspecified: Secondary | ICD-10-CM | POA: Diagnosis not present

## 2022-01-12 DIAGNOSIS — S52501A Unspecified fracture of the lower end of right radius, initial encounter for closed fracture: Secondary | ICD-10-CM | POA: Diagnosis not present

## 2022-01-29 DIAGNOSIS — M25531 Pain in right wrist: Secondary | ICD-10-CM | POA: Diagnosis not present

## 2022-03-02 DIAGNOSIS — M25531 Pain in right wrist: Secondary | ICD-10-CM | POA: Diagnosis not present

## 2022-06-30 DIAGNOSIS — Z01419 Encounter for gynecological examination (general) (routine) without abnormal findings: Secondary | ICD-10-CM | POA: Diagnosis not present

## 2022-06-30 DIAGNOSIS — Z124 Encounter for screening for malignant neoplasm of cervix: Secondary | ICD-10-CM | POA: Diagnosis not present

## 2022-06-30 DIAGNOSIS — Z1231 Encounter for screening mammogram for malignant neoplasm of breast: Secondary | ICD-10-CM | POA: Diagnosis not present

## 2022-06-30 DIAGNOSIS — Z6829 Body mass index (BMI) 29.0-29.9, adult: Secondary | ICD-10-CM | POA: Diagnosis not present

## 2022-08-03 DIAGNOSIS — G4700 Insomnia, unspecified: Secondary | ICD-10-CM | POA: Diagnosis not present

## 2022-08-03 DIAGNOSIS — I10 Essential (primary) hypertension: Secondary | ICD-10-CM | POA: Diagnosis not present

## 2022-08-03 DIAGNOSIS — F331 Major depressive disorder, recurrent, moderate: Secondary | ICD-10-CM | POA: Diagnosis not present

## 2022-08-03 DIAGNOSIS — F411 Generalized anxiety disorder: Secondary | ICD-10-CM | POA: Diagnosis not present

## 2022-08-13 DIAGNOSIS — N870 Mild cervical dysplasia: Secondary | ICD-10-CM | POA: Diagnosis not present

## 2022-08-13 DIAGNOSIS — R87610 Atypical squamous cells of undetermined significance on cytologic smear of cervix (ASC-US): Secondary | ICD-10-CM | POA: Diagnosis not present

## 2022-08-19 DIAGNOSIS — I10 Essential (primary) hypertension: Secondary | ICD-10-CM | POA: Diagnosis not present

## 2022-08-19 DIAGNOSIS — E782 Mixed hyperlipidemia: Secondary | ICD-10-CM | POA: Diagnosis not present

## 2022-08-25 DIAGNOSIS — E782 Mixed hyperlipidemia: Secondary | ICD-10-CM | POA: Diagnosis not present

## 2022-08-25 DIAGNOSIS — I10 Essential (primary) hypertension: Secondary | ICD-10-CM | POA: Diagnosis not present

## 2022-08-25 DIAGNOSIS — R7989 Other specified abnormal findings of blood chemistry: Secondary | ICD-10-CM | POA: Diagnosis not present

## 2022-08-25 DIAGNOSIS — Z789 Other specified health status: Secondary | ICD-10-CM | POA: Diagnosis not present

## 2022-10-13 DIAGNOSIS — M8588 Other specified disorders of bone density and structure, other site: Secondary | ICD-10-CM | POA: Diagnosis not present

## 2022-10-13 DIAGNOSIS — Z8262 Family history of osteoporosis: Secondary | ICD-10-CM | POA: Diagnosis not present

## 2022-10-13 DIAGNOSIS — N958 Other specified menopausal and perimenopausal disorders: Secondary | ICD-10-CM | POA: Diagnosis not present

## 2022-10-13 DIAGNOSIS — Z13820 Encounter for screening for osteoporosis: Secondary | ICD-10-CM | POA: Diagnosis not present

## 2022-11-17 DIAGNOSIS — E559 Vitamin D deficiency, unspecified: Secondary | ICD-10-CM | POA: Diagnosis not present

## 2022-11-17 DIAGNOSIS — E538 Deficiency of other specified B group vitamins: Secondary | ICD-10-CM | POA: Diagnosis not present

## 2022-11-17 DIAGNOSIS — R7989 Other specified abnormal findings of blood chemistry: Secondary | ICD-10-CM | POA: Diagnosis not present

## 2022-11-17 DIAGNOSIS — R5383 Other fatigue: Secondary | ICD-10-CM | POA: Diagnosis not present

## 2022-11-17 DIAGNOSIS — E782 Mixed hyperlipidemia: Secondary | ICD-10-CM | POA: Diagnosis not present

## 2022-11-23 DIAGNOSIS — I10 Essential (primary) hypertension: Secondary | ICD-10-CM | POA: Diagnosis not present

## 2022-11-23 DIAGNOSIS — E559 Vitamin D deficiency, unspecified: Secondary | ICD-10-CM | POA: Diagnosis not present

## 2022-11-23 DIAGNOSIS — E538 Deficiency of other specified B group vitamins: Secondary | ICD-10-CM | POA: Diagnosis not present

## 2022-11-23 DIAGNOSIS — R7989 Other specified abnormal findings of blood chemistry: Secondary | ICD-10-CM | POA: Diagnosis not present

## 2023-04-08 DIAGNOSIS — R5383 Other fatigue: Secondary | ICD-10-CM | POA: Diagnosis not present

## 2023-04-08 DIAGNOSIS — E559 Vitamin D deficiency, unspecified: Secondary | ICD-10-CM | POA: Diagnosis not present

## 2023-04-08 DIAGNOSIS — E785 Hyperlipidemia, unspecified: Secondary | ICD-10-CM | POA: Diagnosis not present

## 2023-04-13 DIAGNOSIS — I10 Essential (primary) hypertension: Secondary | ICD-10-CM | POA: Diagnosis not present

## 2023-04-13 DIAGNOSIS — Z789 Other specified health status: Secondary | ICD-10-CM | POA: Diagnosis not present

## 2023-04-13 DIAGNOSIS — E782 Mixed hyperlipidemia: Secondary | ICD-10-CM | POA: Diagnosis not present

## 2023-04-13 DIAGNOSIS — E038 Other specified hypothyroidism: Secondary | ICD-10-CM | POA: Diagnosis not present

## 2023-04-13 DIAGNOSIS — R400 Somnolence: Secondary | ICD-10-CM | POA: Diagnosis not present

## 2023-04-13 DIAGNOSIS — R0683 Snoring: Secondary | ICD-10-CM | POA: Diagnosis not present

## 2023-04-13 DIAGNOSIS — E559 Vitamin D deficiency, unspecified: Secondary | ICD-10-CM | POA: Diagnosis not present

## 2023-07-20 DIAGNOSIS — Z0000 Encounter for general adult medical examination without abnormal findings: Secondary | ICD-10-CM | POA: Diagnosis not present

## 2023-07-20 DIAGNOSIS — Z13220 Encounter for screening for lipoid disorders: Secondary | ICD-10-CM | POA: Diagnosis not present

## 2023-07-27 DIAGNOSIS — E7800 Pure hypercholesterolemia, unspecified: Secondary | ICD-10-CM | POA: Diagnosis not present

## 2023-07-27 DIAGNOSIS — Z0000 Encounter for general adult medical examination without abnormal findings: Secondary | ICD-10-CM | POA: Diagnosis not present

## 2023-07-27 DIAGNOSIS — R7301 Impaired fasting glucose: Secondary | ICD-10-CM | POA: Diagnosis not present

## 2023-08-02 DIAGNOSIS — E039 Hypothyroidism, unspecified: Secondary | ICD-10-CM | POA: Diagnosis not present

## 2023-08-02 DIAGNOSIS — Z79899 Other long term (current) drug therapy: Secondary | ICD-10-CM | POA: Diagnosis not present

## 2023-08-02 DIAGNOSIS — E782 Mixed hyperlipidemia: Secondary | ICD-10-CM | POA: Diagnosis not present

## 2023-08-02 DIAGNOSIS — R7301 Impaired fasting glucose: Secondary | ICD-10-CM | POA: Diagnosis not present

## 2023-08-02 DIAGNOSIS — Z789 Other specified health status: Secondary | ICD-10-CM | POA: Diagnosis not present

## 2023-08-02 DIAGNOSIS — G4733 Obstructive sleep apnea (adult) (pediatric): Secondary | ICD-10-CM | POA: Diagnosis not present

## 2023-08-02 DIAGNOSIS — G4739 Other sleep apnea: Secondary | ICD-10-CM | POA: Diagnosis not present

## 2023-08-02 DIAGNOSIS — E669 Obesity, unspecified: Secondary | ICD-10-CM | POA: Diagnosis not present

## 2023-08-02 DIAGNOSIS — F411 Generalized anxiety disorder: Secondary | ICD-10-CM | POA: Diagnosis not present

## 2023-08-17 DIAGNOSIS — I10 Essential (primary) hypertension: Secondary | ICD-10-CM | POA: Diagnosis not present

## 2023-08-17 DIAGNOSIS — E039 Hypothyroidism, unspecified: Secondary | ICD-10-CM | POA: Diagnosis not present

## 2023-10-03 DIAGNOSIS — R300 Dysuria: Secondary | ICD-10-CM | POA: Diagnosis not present

## 2023-10-03 DIAGNOSIS — M545 Low back pain, unspecified: Secondary | ICD-10-CM | POA: Diagnosis not present

## 2023-11-08 DIAGNOSIS — E669 Obesity, unspecified: Secondary | ICD-10-CM | POA: Diagnosis not present

## 2023-11-08 DIAGNOSIS — I10 Essential (primary) hypertension: Secondary | ICD-10-CM | POA: Diagnosis not present

## 2023-11-10 DIAGNOSIS — Z1211 Encounter for screening for malignant neoplasm of colon: Secondary | ICD-10-CM | POA: Diagnosis not present

## 2023-11-10 DIAGNOSIS — Z6827 Body mass index (BMI) 27.0-27.9, adult: Secondary | ICD-10-CM | POA: Diagnosis not present

## 2023-11-10 DIAGNOSIS — R87612 Low grade squamous intraepithelial lesion on cytologic smear of cervix (LGSIL): Secondary | ICD-10-CM | POA: Diagnosis not present

## 2023-11-10 DIAGNOSIS — Z01419 Encounter for gynecological examination (general) (routine) without abnormal findings: Secondary | ICD-10-CM | POA: Diagnosis not present

## 2023-11-10 DIAGNOSIS — R87810 Cervical high risk human papillomavirus (HPV) DNA test positive: Secondary | ICD-10-CM | POA: Diagnosis not present

## 2023-11-10 DIAGNOSIS — Z124 Encounter for screening for malignant neoplasm of cervix: Secondary | ICD-10-CM | POA: Diagnosis not present

## 2024-01-24 DIAGNOSIS — Z1231 Encounter for screening mammogram for malignant neoplasm of breast: Secondary | ICD-10-CM | POA: Diagnosis not present
